# Patient Record
Sex: Female | Born: 1960 | Race: White | Hispanic: No | Marital: Single | Smoking: Never smoker
Health system: Southern US, Community
[De-identification: ages and names within clinical notes are randomized; demographics above are authoritative.]

## PROBLEM LIST (undated history)

## (undated) HISTORY — PX: BREAST SURGERY: SHX581

---

## 2000-07-08 ENCOUNTER — Emergency Department (HOSPITAL_COMMUNITY): Admission: EM | Admit: 2000-07-08 | Discharge: 2000-07-08 | Payer: Self-pay | Admitting: Emergency Medicine

## 2000-07-08 ENCOUNTER — Encounter: Payer: Self-pay | Admitting: Emergency Medicine

## 2002-08-16 ENCOUNTER — Encounter: Payer: Self-pay | Admitting: Family Medicine

## 2002-08-16 ENCOUNTER — Encounter: Admission: RE | Admit: 2002-08-16 | Discharge: 2002-08-16 | Payer: Self-pay | Admitting: *Deleted

## 2003-04-11 ENCOUNTER — Encounter: Admission: RE | Admit: 2003-04-11 | Discharge: 2003-04-11 | Payer: Self-pay | Admitting: Family Medicine

## 2003-09-26 ENCOUNTER — Emergency Department (HOSPITAL_COMMUNITY): Admission: EM | Admit: 2003-09-26 | Discharge: 2003-09-26 | Payer: Self-pay | Admitting: Emergency Medicine

## 2010-06-03 ENCOUNTER — Encounter: Payer: Self-pay | Admitting: Internal Medicine

## 2020-02-23 ENCOUNTER — Other Ambulatory Visit: Payer: Self-pay

## 2020-02-23 ENCOUNTER — Encounter (HOSPITAL_COMMUNITY): Payer: Self-pay | Admitting: *Deleted

## 2020-02-23 ENCOUNTER — Emergency Department (HOSPITAL_COMMUNITY): Payer: BLUE CROSS/BLUE SHIELD

## 2020-02-23 ENCOUNTER — Emergency Department (HOSPITAL_COMMUNITY)
Admission: EM | Admit: 2020-02-23 | Discharge: 2020-02-23 | Disposition: A | Discharge status: Home / Self Care | Payer: BLUE CROSS/BLUE SHIELD | Attending: Emergency Medicine | Admitting: Emergency Medicine

## 2020-02-23 DIAGNOSIS — R0789 Other chest pain: Secondary | ICD-10-CM | POA: Insufficient documentation

## 2020-02-23 DIAGNOSIS — R410 Disorientation, unspecified: Secondary | ICD-10-CM | POA: Insufficient documentation

## 2020-02-23 DIAGNOSIS — F419 Anxiety disorder, unspecified: Secondary | ICD-10-CM | POA: Insufficient documentation

## 2020-02-23 DIAGNOSIS — R479 Unspecified speech disturbances: Secondary | ICD-10-CM | POA: Diagnosis present

## 2020-02-23 LAB — RAPID URINE DRUG SCREEN, HOSP PERFORMED
Amphetamines: NOT DETECTED
Barbiturates: NOT DETECTED
Benzodiazepines: NOT DETECTED
Cocaine: NOT DETECTED
Opiates: NOT DETECTED
Tetrahydrocannabinol: POSITIVE — AB

## 2020-02-23 LAB — COMPREHENSIVE METABOLIC PANEL
ALT: 20 U/L (ref 0–44)
AST: 23 U/L (ref 15–41)
Albumin: 4.9 g/dL (ref 3.5–5.0)
Alkaline Phosphatase: 63 U/L (ref 38–126)
Anion gap: 16 — ABNORMAL HIGH (ref 5–15)
BUN: 28 mg/dL — ABNORMAL HIGH (ref 6–20)
CO2: 25 mmol/L (ref 22–32)
Calcium: 9.9 mg/dL (ref 8.9–10.3)
Chloride: 100 mmol/L (ref 98–111)
Creatinine, Ser: 0.88 mg/dL (ref 0.44–1.00)
GFR, Estimated: >60 mL/min (ref >60)
Glucose, Bld: 187 mg/dL — ABNORMAL HIGH (ref 70–99)
Potassium: 3.3 mmol/L — ABNORMAL LOW (ref 3.5–5.1)
Sodium: 141 mmol/L (ref 135–145)
Total Bilirubin: 0.7 mg/dL (ref 0.3–1.2)
Total Protein: 7.9 g/dL (ref 6.5–8.1)

## 2020-02-23 LAB — CBC
HCT: 42.1 % (ref 36.0–46.0)
Hemoglobin: 13.4 g/dL (ref 12.0–15.0)
MCH: 32 pg (ref 26.0–34.0)
MCHC: 31.8 g/dL (ref 30.0–36.0)
MCV: 100.5 fL — ABNORMAL HIGH (ref 80.0–100.0)
Platelets: 217 10*3/uL (ref 150–400)
RBC: 4.19 MIL/uL (ref 3.87–5.11)
RDW: 13.5 % (ref 11.5–15.5)
WBC: 8.8 10*3/uL (ref 4.0–10.5)
nRBC: 0 % (ref 0.0–0.2)

## 2020-02-23 LAB — ETHANOL: Alcohol, Ethyl (B): <10 mg/dL (ref <10)

## 2020-02-23 LAB — URINALYSIS, ROUTINE W REFLEX MICROSCOPIC
Bilirubin Urine: NEGATIVE
Glucose, UA: NEGATIVE mg/dL
Hgb urine dipstick: NEGATIVE
Ketones, ur: 5 mg/dL — AB
Leukocytes,Ua: NEGATIVE
Nitrite: NEGATIVE
Protein, ur: NEGATIVE mg/dL
Specific Gravity, Urine: 1.024 (ref 1.005–1.030)
pH: 5 (ref 5.0–8.0)

## 2020-02-23 LAB — TROPONIN I (HIGH SENSITIVITY): Troponin I (High Sensitivity): 2 ng/L (ref <18)

## 2020-02-23 LAB — CBG MONITORING, ED: Glucose-Capillary: 154 mg/dL — ABNORMAL HIGH (ref 70–99)

## 2020-02-23 MED ORDER — LORAZEPAM 1 MG PO TABS
1.0000 mg | ORAL_TABLET | Freq: Once | ORAL | Status: AC
  Administered 2020-02-23: 1 mg via ORAL
  Filled 2020-02-23: qty 1

## 2020-02-23 MED ORDER — HYDROXYZINE HCL 25 MG PO TABS: 25.0000 mg | ORAL_TABLET | Freq: Three times a day (TID) | ORAL | 0 refills | Status: AC | PRN

## 2020-02-23 NOTE — Discharge Instructions (Addendum)
Take Atarax as needed for anxiety.  Please follow-up with primary doctor regarding episode today.  If you have any episodes of chest pain, difficulty breathing, numbness, weakness, vision changes or other new concerning symptom.

## 2020-02-23 NOTE — ED Triage Notes (Addendum)
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 No physical neuro deficit noted. States she feels worse now than she did at work.

## 2020-02-23 NOTE — ED Provider Notes (Signed)
[facility]-EMERGENCY DEPT Provider Note   CSN: [id number] Arrival date & time: 02/23/20  1144     History Chief Complaint  Patient presents with  . Altered Mental Status    Victoria Shields is a 59 y.o. female.  Presents to ER with concern for sudden onset of anxious sensation, chest tightness, difficulty making a sentence.  She reports yesterday she was asymptomatic except noted a couple of loose stools, nonbloody nontarry.  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  However when she was at work she noted a sudden onset of anxious feeling, some shortness of breath, lightheadedness and felt confused.  States that she felt like she had a hard time forming sentences.  This has been constant since then.  No associated vision changes, speech changes except for what was noted previously, no numbness or weakness.  Her chest pain is described as tightness sensation, mild to moderate, constant, not associated with exertion.  She denies any medical problems, non-smoker.  Vet tech.  HPI     History reviewed. No pertinent past medical history.  There are no problems to display for this patient.   Past Surgical History:  Procedure Laterality Date  . BREAST SURGERY       OB History   No obstetric history on file.     No family history on file.  Social History   Tobacco Use  . Smoking status: Never Smoker  . Smokeless tobacco: Never Used  Substance Use Topics  . Alcohol use: Yes  . Drug use: Never    Home Medications Prior to Admission medications   Medication Sig Start Date End Date Taking? Authorizing Provider  diphenhydrAMINE HCl, Sleep, (ZZZQUIL PO) Take 3 tablets by mouth at bedtime.   Yes [provider]  loperamide (IMODIUM) 2 MG capsule Take 2 mg by mouth as needed for diarrhea or loose stools.   Yes [provider]  Multiple Vitamin (MULTIVITAMIN WITH MINERALS) TABS tablet Take 1 tablet by mouth daily.   Yes  [provider]  Turmeric (QC TUMERIC COMPLEX PO) Take 1 tablet by mouth daily.   Yes [provider]  VITAMIN D PO Take 1 capsule by mouth daily.   Yes [provider]  hydrOXYzine (ATARAX/VISTARIL) 25 MG tablet Take 1 tablet (25 mg total) by mouth every 8 (eight) hours as needed for anxiety. 02/23/20   Milagros Loll, MD    Allergies    Neomycin-polymyxin b gu and Codeine  Review of Systems   Review of Systems  Constitutional: Negative for chills and fever.  HENT: Negative for ear pain and sore throat.   Eyes: Negative for pain and visual disturbance.  Respiratory: Positive for chest tightness. Negative for cough and shortness of breath.   Cardiovascular: Positive for chest pain. Negative for palpitations.  Gastrointestinal: Negative for abdominal pain and vomiting.  Genitourinary: Negative for dysuria and hematuria.  Musculoskeletal: Negative for arthralgias and back pain.  Skin: Negative for color change and rash.  Neurological: Positive for speech difficulty. Negative for seizures and syncope.  All other systems reviewed and are negative.   Physical Exam Updated Vital Signs BP 137/76 (BP Location: Right Arm)   Pulse 87   Temp 98.5 F (36.9 C) (Oral)   Resp (!) 25   Ht 5\' 7"  (1.702 m)   Wt 77.1 kg   SpO2 99%   BMI 26.63 kg/m   Physical Exam Vitals and nursing note reviewed.  Constitutional:  General: She is not in acute distress.    Appearance: She is well-developed.  HENT:     Head: Normocephalic and atraumatic.  Eyes:     Conjunctiva/sclera: Conjunctivae normal.  Cardiovascular:     Rate and Rhythm: Normal rate and regular rhythm.     Heart sounds: No murmur heard.   Pulmonary:     Effort: Pulmonary effort is normal. No respiratory distress.     Breath sounds: Normal breath sounds.  Abdominal:     Palpations: Abdomen is soft.     Tenderness: There is no abdominal tenderness.  Musculoskeletal:     Cervical back: Neck  supple.  Skin:    General: Skin is warm and dry.  Neurological:     Mental Status: She is alert.     Comments: AAOx3 CN 2-12 intact, speech clear visual fields intact 5/5 strength in b/l UE and LE Sensation to light touch intact in b/l UE and LE Normal FNF Normal gait  Psychiatric:     Comments: Anxious, does not demonstrate SI or HI     ED Results / Procedures / Treatments   Labs (all labs ordered are listed, but only abnormal results are displayed) Labs Reviewed  COMPREHENSIVE METABOLIC PANEL - Abnormal; Notable for the following components:      Result Value   Potassium 3.3 (*)    Glucose, Bld 187 (*)    BUN 28 (*)    Anion gap 16 (*)    All other components within normal limits  CBC - Abnormal; Notable for the following components:   MCV 100.5 (*)    All other components within normal limits  RAPID URINE DRUG SCREEN, HOSP PERFORMED - Abnormal; Notable for the following components:   Tetrahydrocannabinol POSITIVE (*)    All other components within normal limits  URINALYSIS, ROUTINE W REFLEX MICROSCOPIC - Abnormal; Notable for the following components:   Color, Urine AMBER (*)    APPearance HAZY (*)    Ketones, ur 5 (*)    All other components within normal limits  CBG MONITORING, ED - Abnormal; Notable for the following components:   Glucose-Capillary 154 (*)    All other components within normal limits  ETHANOL  TROPONIN I (HIGH SENSITIVITY)    EKG EKG Interpretation  Date/Time:  Wednesday February 23 2020 11:55:59 EDT Ventricular Rate:  106 PR Interval:    QRS Duration: 106 QT Interval:  348 QTC Calculation: 463 R Axis:   59 Text Interpretation: Sinus tachycardia Consider left atrial enlargement 12 Lead; Mason-Likar Confirmed by Marianna Fuss ([id number]) on 02/23/2020 12:05:01 PM   Radiology DG Chest 2 View  Result Date: 02/23/2020 CLINICAL DATA:  Short of breath, anxious EXAM: CHEST - 2 VIEW COMPARISON:  None. FINDINGS: Normal mediastinum and cardiac  silhouette. Normal pulmonary vasculature. No evidence of effusion, infiltrate, or pneumothorax. No acute bony abnormality. IMPRESSION: No acute cardiopulmonary process. Electronically Signed   By: Genevive Bi M.D.   On: 02/23/2020 14:06   CT Head Wo Contrast  Result Date: 02/23/2020 CLINICAL DATA:  Delirium EXAM: CT HEAD WITHOUT CONTRAST TECHNIQUE: Contiguous axial images were obtained from the base of the skull through the vertex without intravenous contrast. COMPARISON:  None. FINDINGS: Brain: No evidence of acute infarction, hemorrhage, hydrocephalus, extra-axial collection or mass lesion/mass effect. Vascular: Negative for hyperdense vessel Skull: Negative Sinuses/Orbits: Paranasal sinuses clear.  Negative orbit. Other: None IMPRESSION: Negative CT head Electronically Signed   By: Marlan Palau M.D.   On: 02/23/2020 14:25  Procedures Procedures (including critical care time)  Medications Ordered in ED Medications  LORazepam (ATIVAN) tablet 1 mg (1 mg Oral Given 02/23/20 1219)    ED Course  I have reviewed the triage vital signs and the nursing notes.  Pertinent labs & imaging results that were available during my care of the patient were reviewed by me and considered in my medical decision making (see chart for details).    MDM Rules/Calculators/A&P                         59 year old lady presents to ER with concern for confusion, foggy headedness.. I assessed patient, she had no focal neurologic deficit, well-appearing with normal vital signs.  EKG without acute ischemic changes, troponin within normal limits.  Doubt ACS.  CT head negative.  Given neurologic exam, CT, very low suspicion for acute neurologic process today.  The remainder of her labs were grossly stable.  Noted mild hypokalemia.  Suspect her symptoms most likely related to anxiety.  Pain improved after receiving 1 dose of Ativan.  On reassessment patient has no ongoing symptoms and believe she is stable for  discharge home.  Recommended follow-up with her primary doctor, return for any recurrent or worsening symptoms.    After the discussed management above, the patient was determined to be safe for discharge.  The patient was in agreement with this plan and all questions regarding their care were answered.  ED return precautions were discussed and the patient will return to the ED with any significant worsening of condition.    Final Clinical Impression(s) / ED Diagnoses Final diagnoses:  Transient confusion    Rx / DC Orders ED Discharge Orders         Ordered    hydrOXYzine (ATARAX/VISTARIL) 25 MG tablet  Every 8 hours PRN        02/23/20 1506           Milagros Loll, MD 02/24/20 [phone]

## 2021-07-01 IMAGING — CT CT HEAD W/O CM
3 series · 15 of 47 positions shown, 18 images · non-contrast
Comparison: None.

CLINICAL DATA: Delirium

EXAM:
CT HEAD WITHOUT CONTRAST
TECHNIQUE: Contiguous axial images were obtained from the base of the skull
through the vertex without intravenous contrast.

[Series 2: head wo · axial · 0.51mm/px · z∈[+1547,+1677]mm · 9 of 32 slices shown, 12 images]
[im 3/32  brain]
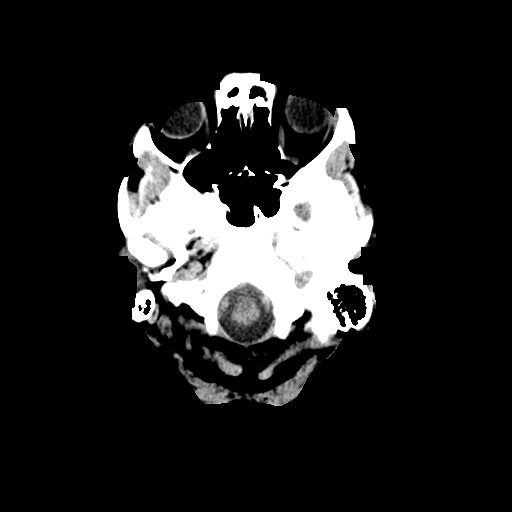
[im 3/32  bone]
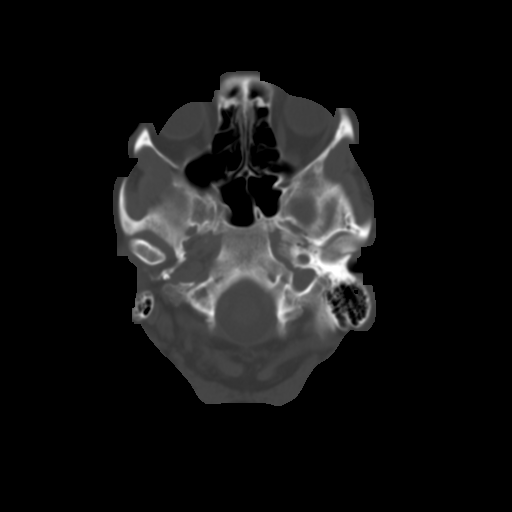
[im 6/32  brain]
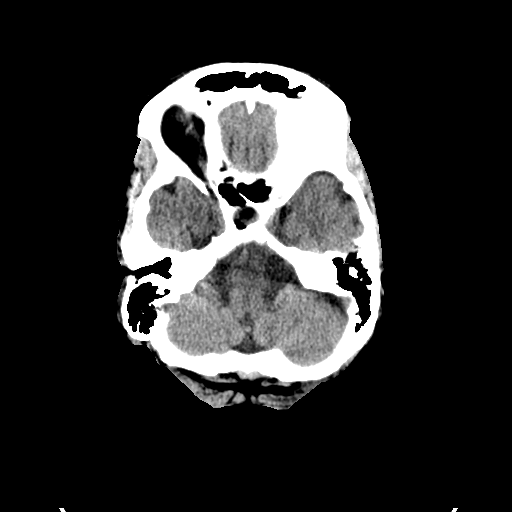
[im 9/32  brain]
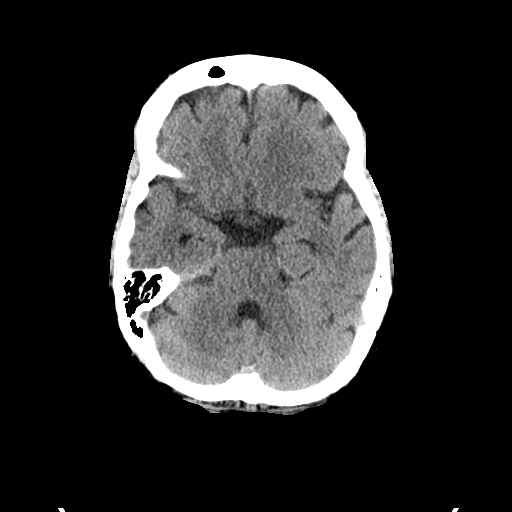
[im 12/32  brain]
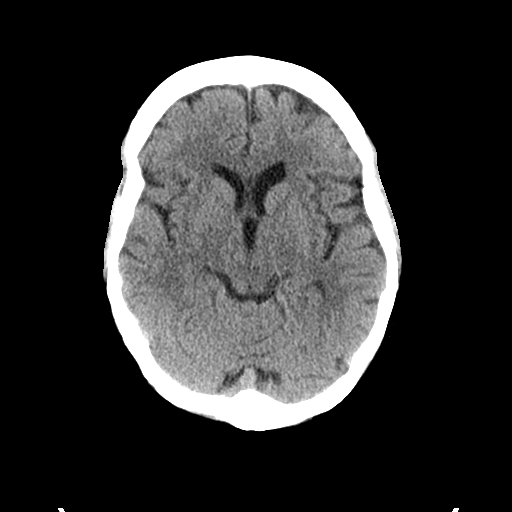
[im 17/32  brain]
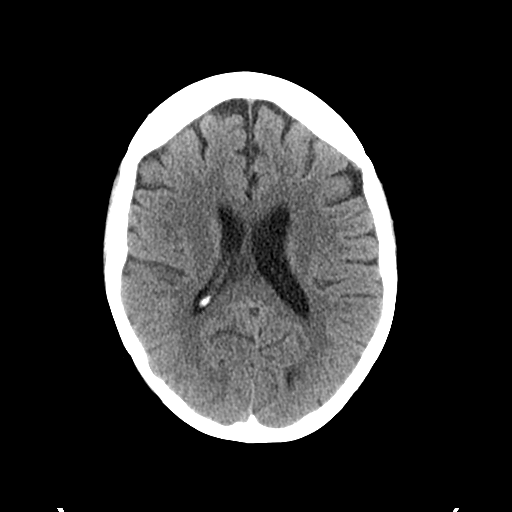
[im 17/32  bone]
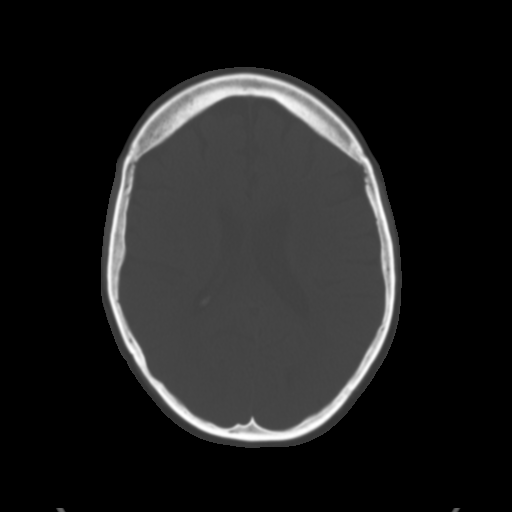
[im 20/32  brain]
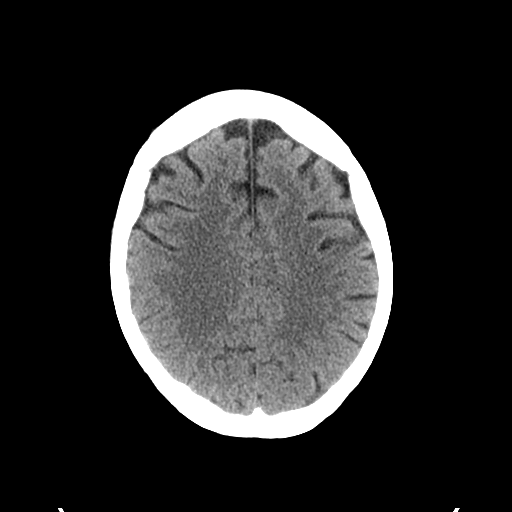
[im 23/32  brain]
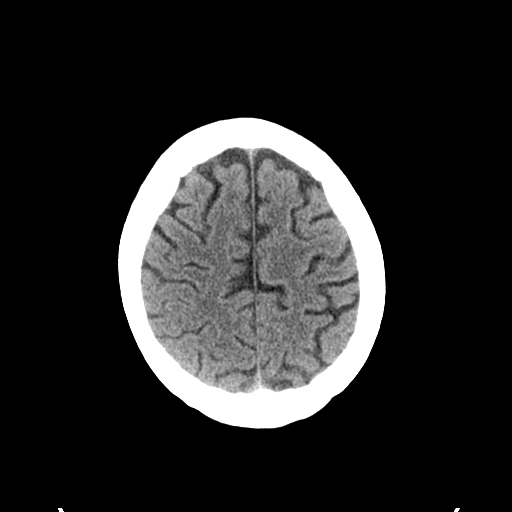
[im 26/32  brain]
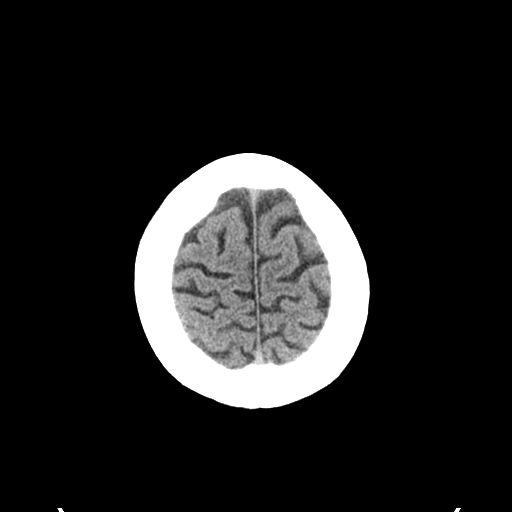
[im 29/32  brain]
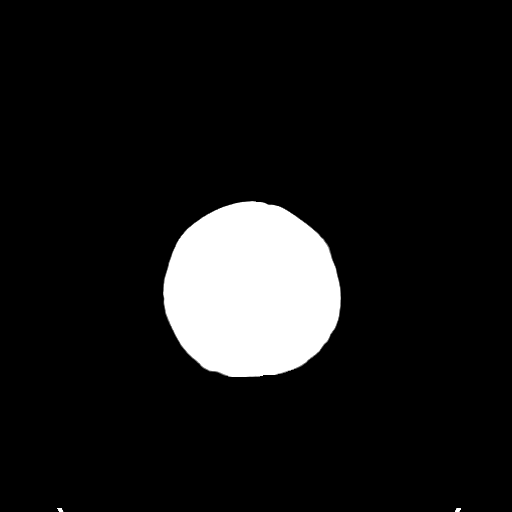
[im 29/32  bone]
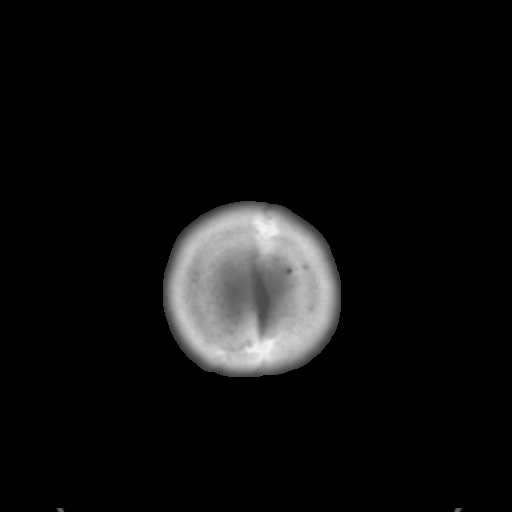

[Series 5: coronal soft tissue · coronal · 0.35mm/px · 3 of 75 slices shown]
[im 26/75  brain]
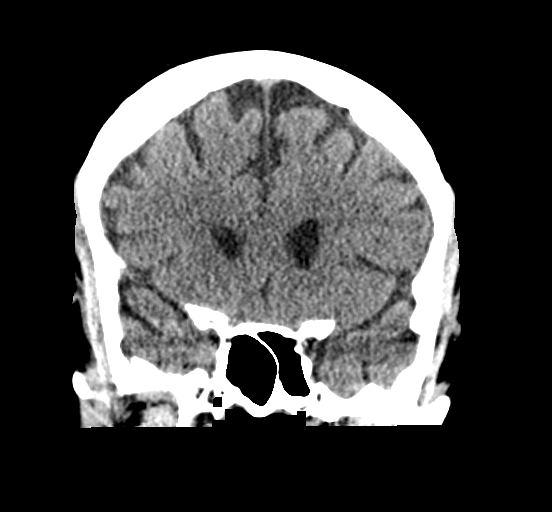
[im 34/75  brain]
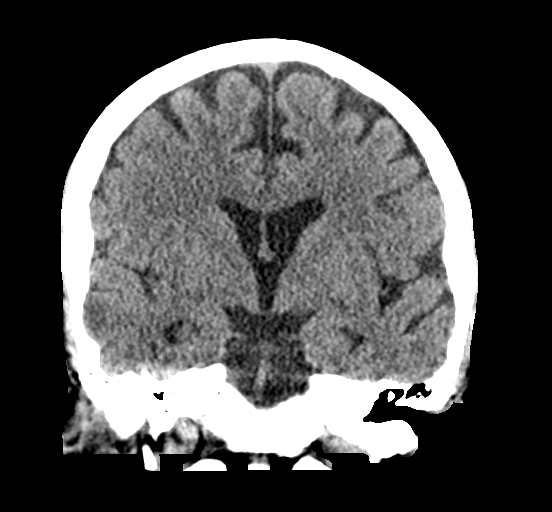
[im 41/75  brain]
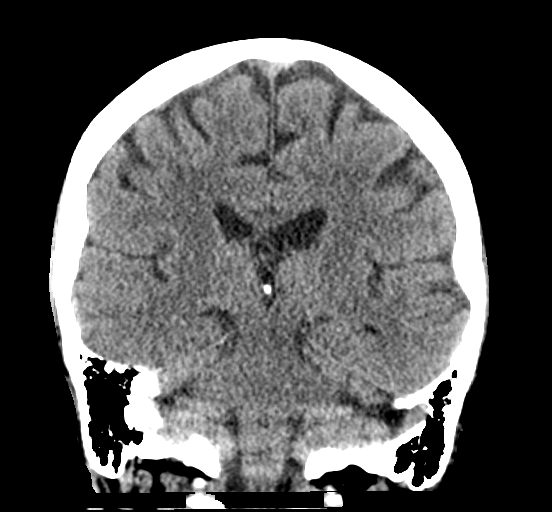

[Series 6: sagittal soft tissue · sagittal · 0.31mm/px · 3 of 63 slices shown]
[im 21/63  brain]
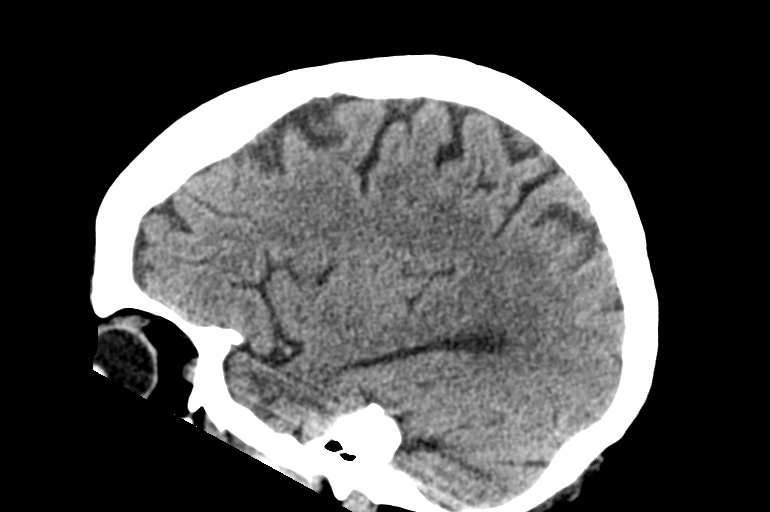
[im 32/63  brain]
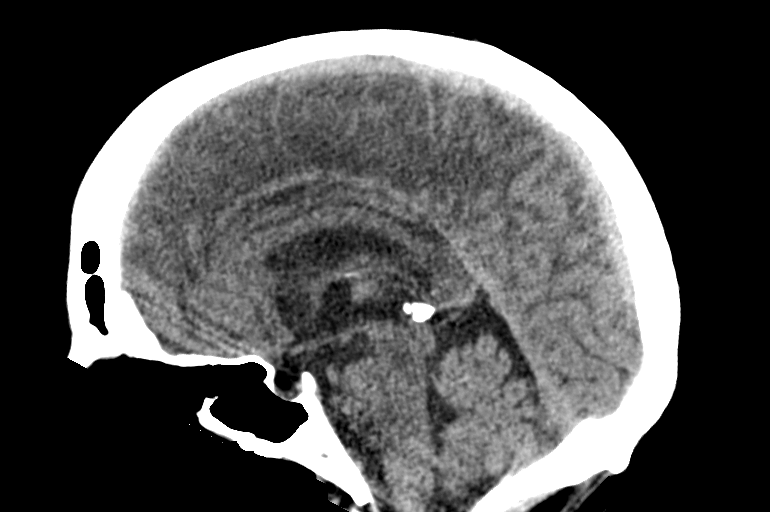
[im 42/63  brain]
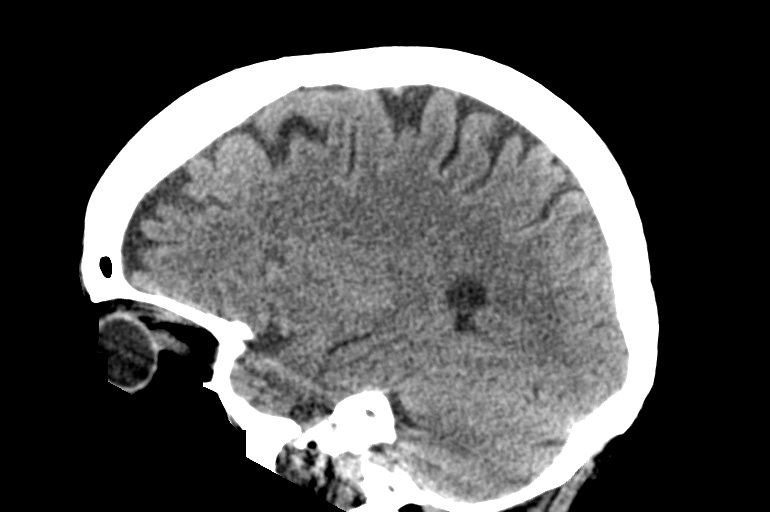

[15 of 47 positions shown; findings below may reference images not displayed]

FINDINGS: Brain: No evidence of acute infarction, hemorrhage, hydrocephalus,
extra-axial collection or mass lesion/mass effect.

Vascular: Negative for hyperdense vessel

Skull: Negative

Sinuses/Orbits: Paranasal sinuses clear.  Negative orbit.

Other: None
IMPRESSION: Negative CT head
